# Patient Record
Sex: Female | Born: 1975 | Race: White | Hispanic: No | Marital: Single
Health system: Southern US, Community
[De-identification: ages and names within clinical notes are randomized; demographics above are authoritative.]

---

## 2011-12-27 ENCOUNTER — Ambulatory Visit: Payer: Self-pay | Admitting: Specialist

## 2011-12-27 LAB — COMPREHENSIVE METABOLIC PANEL
Alkaline Phosphatase: 37 U/L — ABNORMAL LOW (ref 50–136)
Anion Gap: 11 (ref 7–16)
BUN: 10 mg/dL (ref 7–18)
Bilirubin,Total: 0.6 mg/dL (ref 0.2–1.0)
Calcium, Total: 8.7 mg/dL (ref 8.5–10.1)
Chloride: 104 mmol/L (ref 98–107)
Creatinine: 0.68 mg/dL (ref 0.60–1.30)
EGFR (African American): >60
Osmolality: 279 (ref 275–301)
Potassium: 3.8 mmol/L (ref 3.5–5.1)
SGPT (ALT): 61 U/L
Sodium: 141 mmol/L (ref 136–145)
Total Protein: 7.7 g/dL (ref 6.4–8.2)

## 2011-12-27 LAB — CBC WITH DIFFERENTIAL/PLATELET
Basophil #: 0.1 10*3/uL (ref 0.0–0.1)
Basophil %: 1.1 %
Eosinophil #: 0.5 10*3/uL (ref 0.0–0.7)
Eosinophil %: 7.4 %
HCT: 42.8 % (ref 35.0–47.0)
HGB: 14.4 g/dL (ref 12.0–16.0)
MCHC: 33.7 g/dL (ref 32.0–36.0)
MCV: 89 fL (ref 80–100)
Monocyte #: 0.5 10*3/uL (ref 0.0–0.7)
Monocyte %: 6.5 %
Neutrophil #: 3.5 10*3/uL (ref 1.4–6.5)
Neutrophil %: 49.4 %
RBC: 4.81 10*6/uL (ref 3.80–5.20)
RDW: 14 % (ref 11.5–14.5)
WBC: 7 10*3/uL (ref 3.6–11.0)

## 2011-12-27 LAB — APTT: Activated PTT: 25.9 secs (ref 23.6–35.9)

## 2011-12-27 LAB — AMYLASE: Amylase: 31 U/L (ref 25–115)

## 2011-12-27 LAB — PROTIME-INR
INR: 0.9
Prothrombin Time: 12.8 secs (ref 11.5–14.7)

## 2012-01-01 ENCOUNTER — Ambulatory Visit: Payer: Self-pay | Admitting: Specialist

## 2012-01-11 ENCOUNTER — Ambulatory Visit: Payer: Self-pay | Admitting: Specialist

## 2012-02-19 ENCOUNTER — Ambulatory Visit: Payer: Self-pay | Admitting: Specialist

## 2012-03-04 ENCOUNTER — Inpatient Hospital Stay: Payer: Self-pay | Admitting: Specialist

## 2012-03-04 LAB — PREGNANCY, URINE: Pregnancy Test, Urine: NEGATIVE m[IU]/mL

## 2012-03-05 LAB — CBC WITH DIFFERENTIAL/PLATELET
Basophil %: 0.4 %
Eosinophil #: 0 10*3/uL (ref 0.0–0.7)
Eosinophil %: 0.1 %
Lymphocyte #: 0.7 10*3/uL — ABNORMAL LOW (ref 1.0–3.6)
Lymphocyte %: 9.4 %
MCH: 29.8 pg (ref 26.0–34.0)
MCHC: 33.4 g/dL (ref 32.0–36.0)
Monocyte #: 0.3 x10 3/mm (ref 0.2–0.9)
Monocyte %: 4 %
Neutrophil %: 86.1 %
Platelet: 180 10*3/uL (ref 150–440)
RBC: 4.41 10*6/uL (ref 3.80–5.20)
RDW: 14.1 % (ref 11.5–14.5)
WBC: 7.9 10*3/uL (ref 3.6–11.0)

## 2012-03-05 LAB — ALBUMIN: Albumin: 3.9 g/dL (ref 3.4–5.0)

## 2012-03-05 LAB — BASIC METABOLIC PANEL
Anion Gap: 11 (ref 7–16)
BUN: 4 mg/dL — ABNORMAL LOW (ref 7–18)
Creatinine: 0.61 mg/dL (ref 0.60–1.30)
EGFR (Non-African Amer.): >60
Osmolality: 273 (ref 275–301)
Potassium: 4.6 mmol/L (ref 3.5–5.1)
Sodium: 138 mmol/L (ref 136–145)

## 2012-03-05 LAB — MAGNESIUM: Magnesium: 1.6 mg/dL — ABNORMAL LOW

## 2012-03-05 LAB — PHOSPHORUS: Phosphorus: 2.6 mg/dL (ref 2.5–4.9)

## 2012-03-25 ENCOUNTER — Ambulatory Visit: Payer: Self-pay | Admitting: Specialist

## 2012-04-12 ENCOUNTER — Ambulatory Visit: Payer: Self-pay | Admitting: Specialist

## 2012-11-28 IMAGING — RF DG UGI W/O KUB
1 series · 15 of 16 positions shown · non-contrast
Comparison: none

REASON FOR EXAM: fatigue obstructed sleep apnea morbid obesity snoring
joint pain
COMMENTS:

[Series 1: run · 15 of 16 slices shown]
[im 1/16]
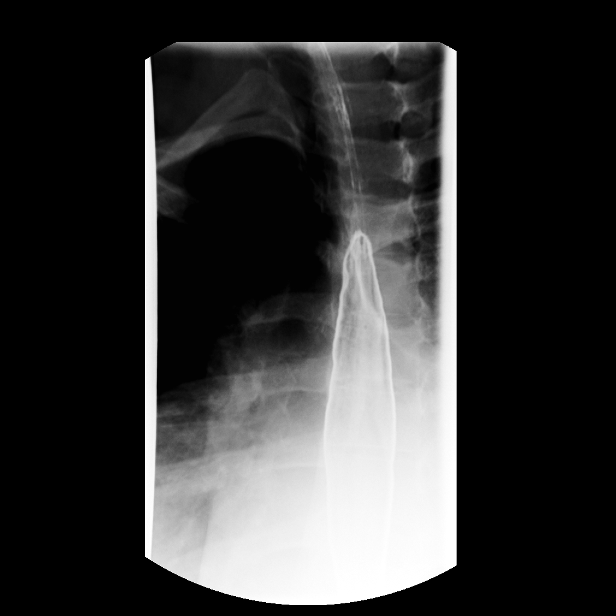
[im 2/16]
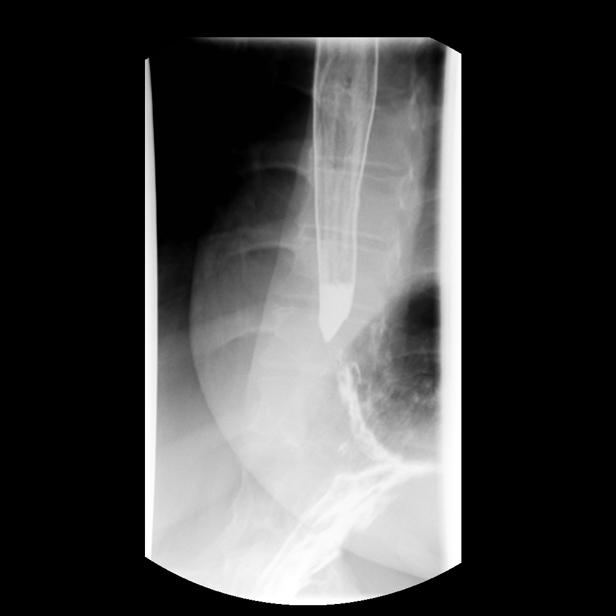
[im 3/16]
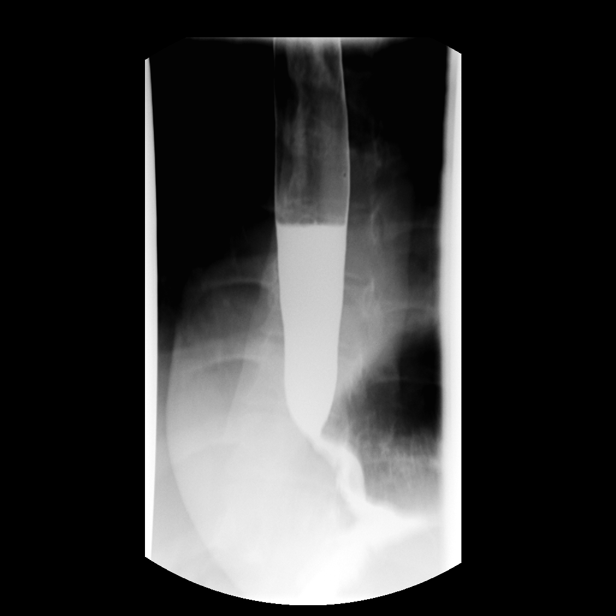
[im 4/16]
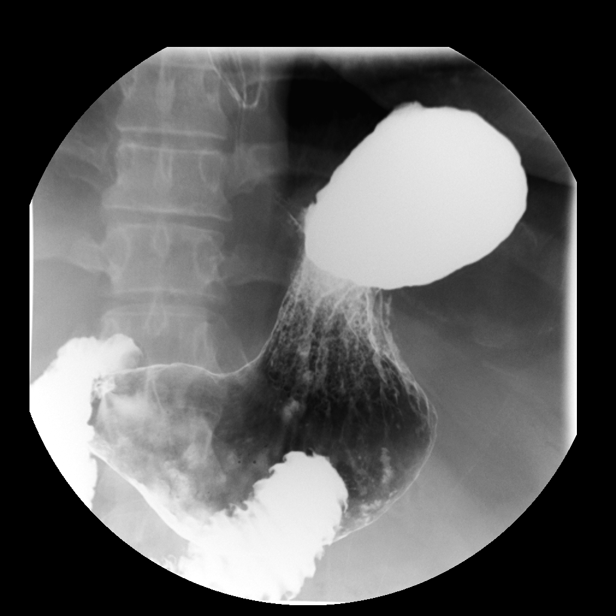
[im 5/16]
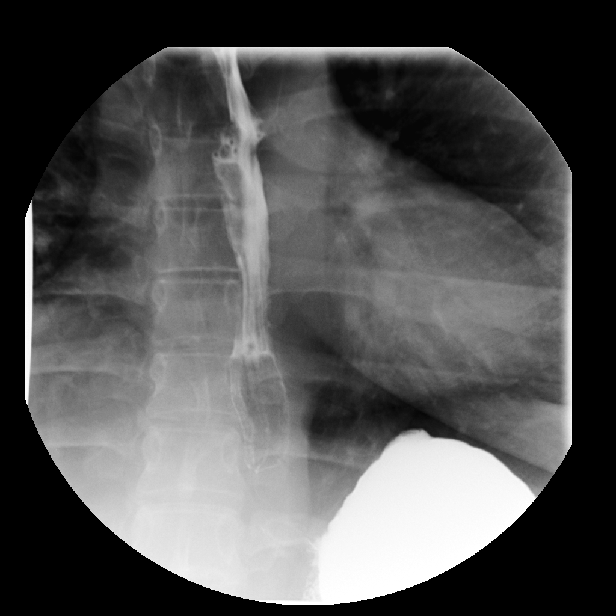
[im 6/16]
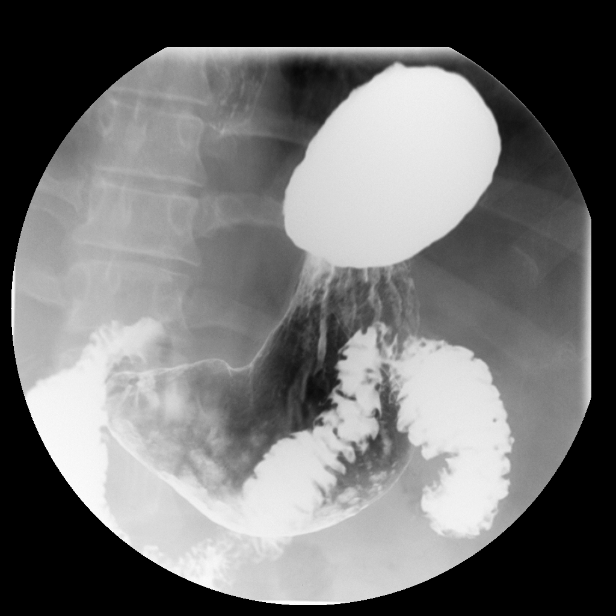
[im 7/16]
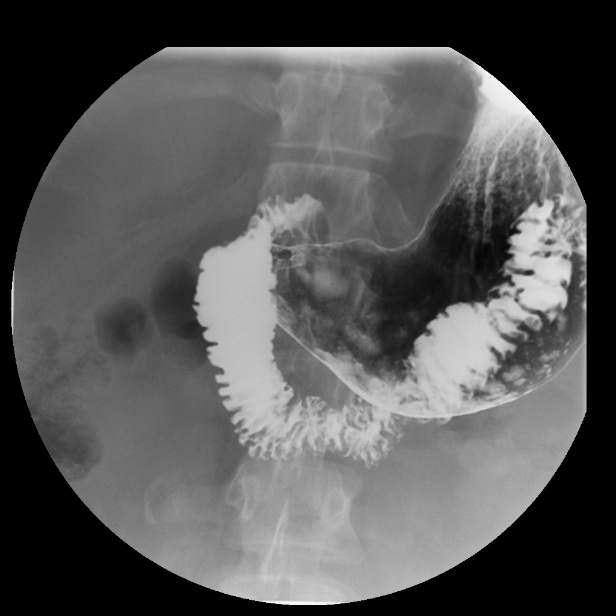
[im 9/16]
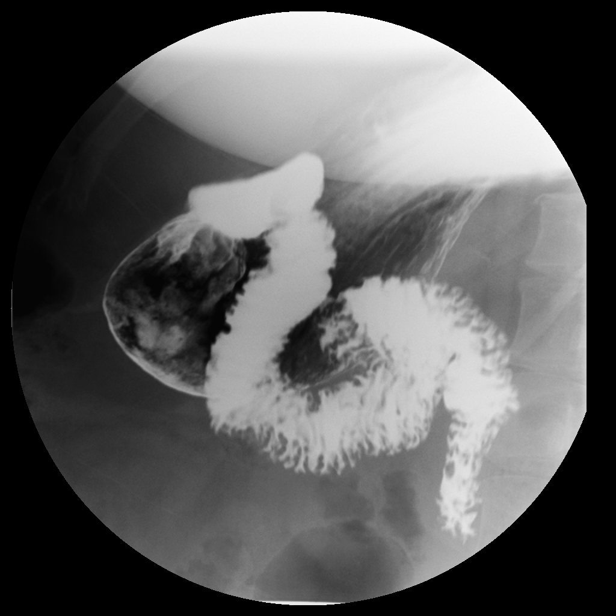
[im 10/16]
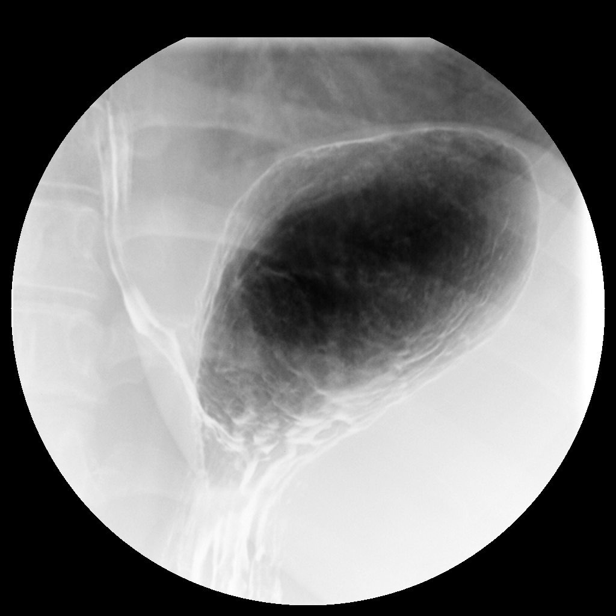
[im 11/16]
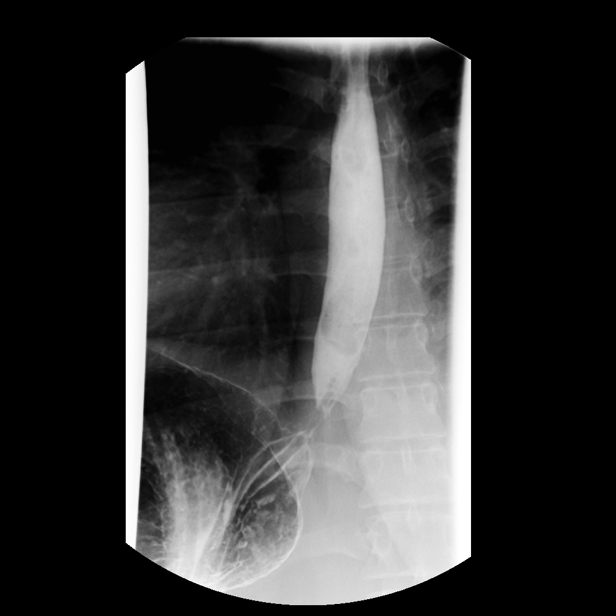
[im 12/16]
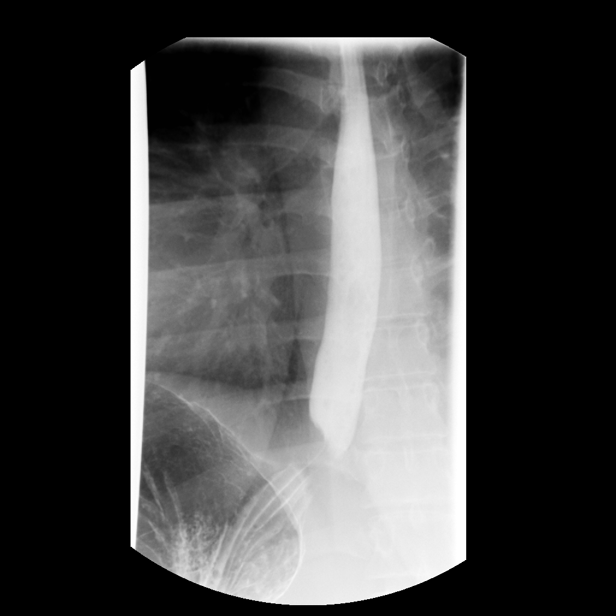
[im 13/16]
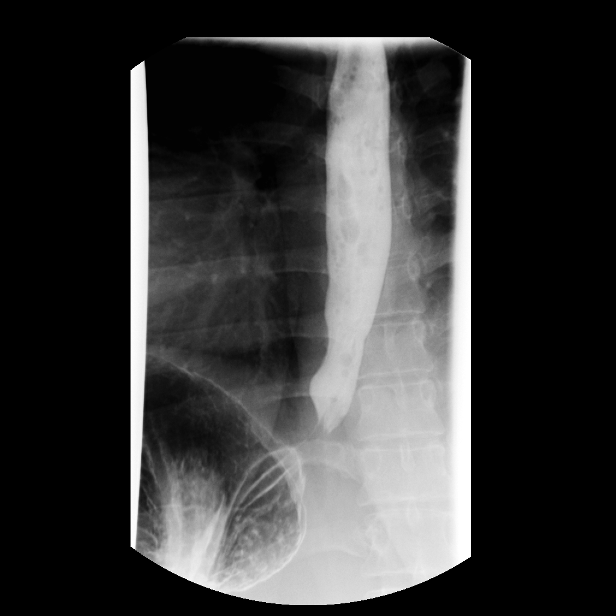
[im 14/16]
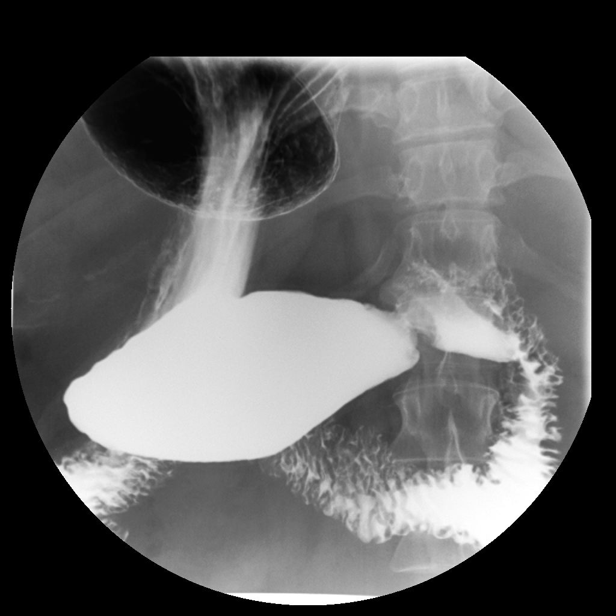
[im 15/16]
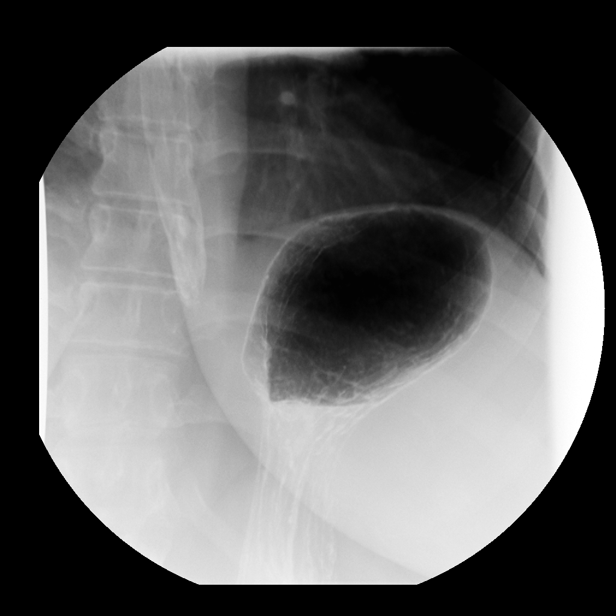
[im 16/16]
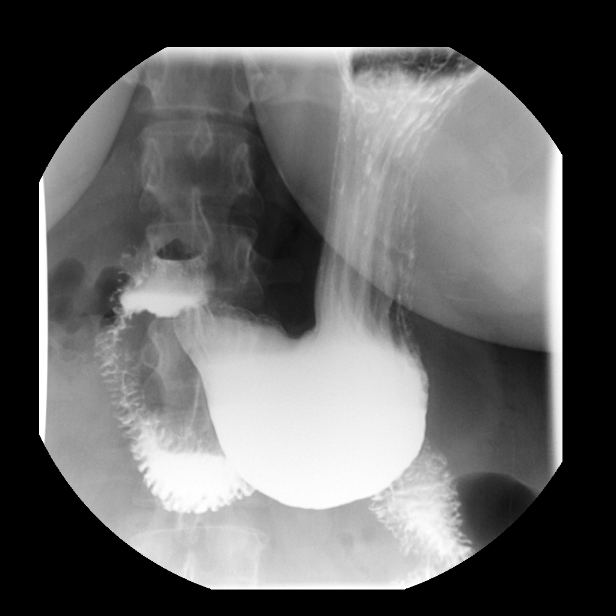

[15 of 16 positions shown; findings below may reference images not displayed]

PROCEDURE:     FL  - FL UPPER GI  - December 27, 2011 [DATE]

RESULT:     The anticipated procedure was discussed with Ms. Tiger. She
voiced her willingness to proceed. The patient ingested thick and thin
barium and gas-forming crystals without difficulty. The esophagus was normal
and a survey fashion. There was no evidence of a hiatal hernia. A small
amount of gastroesophageal reflux was demonstrated. The stomach distended
well. The gastric mucosal folds were normal in appearance. Gastric emptying
appeared normal. The duodenal bulb and C-sweep were normal in appearance.
The 12 mm barium pill passed without difficulty.
IMPRESSION: There is a small amount of gastroesophageal reflux.
Otherwise the examination is within the limits of normal.

## 2015-03-06 NOTE — Op Note (Signed)
PATIENT NAME:  Kara DeutscherCROWELL, Amorette S MR#:  [id number] DATE OF BIRTH:  09-24-1976  DATE OF PROCEDURE:  03/04/2012  PREOPERATIVE DIAGNOSES:  1. Morbid obesity. 2. Possible hiatal hernia.  3. Multiple comorbids.   POSTOPERATIVE DIAGNOSES:  1. Morbid obesity. 2. No significant hiatal hernia.  3. Multiple comorbids.   PROCEDURE:  Laparoscopic sleeve gastrectomy.  SURGEON: Primus BravoJon Bruce, MD  ASSISTANT:  None.  ANESTHESIA:  General endotracheal.  CLINICAL INDICATION:  See History and Physical.   COMPLICATIONS: None.  ESTIMATED BLOOD LOSS: None.  DETAILS OF PROCEDURE:  The patient was taken to the operating room and placed on the operating room table, in the supine position, with appropriate monitors and supplemental oxygen being delivered.  Broad spectrum IV antibiotics were administered. The patient was placed under general anesthesia without incident.  The abdomen was prepped and draped in the usual sterile fashion.  Access was obtained using 5 mm Optical trocar. Pneumoperitoneum was established without difficulty. Multiple other ports were placed in preparation for sleeve gastrectomy. A liver retractor was placed without incident. The entire stomach was mobilized from 5 cm from the pylorus all the way up to the fundus, and the fundus was mobilized off the left crura as well completely freeing up the posterior portion of the stomach. Posterior attachments were taken down so the crura could be visualized from both sides.  At that point, everything was removed from the stomach and a 2234 [place] Bougie was placed down into the antrum. An Echelon green load stapler was used to bisect the antrum on first fire starting approximately 5 to 6 cm from the pylorus. I then continued up along the Bougie using a blue load stapler with excellent affect with care not to get too close to the Bougie itself, with minimal traction. This continued all the way up to the left crura. The excess stomach was placed on the side and  the Bougie was removed and endoscopy showed no evidence of obstruction at that time.  The excess stomach was removed through the abdominal cavity, and the wounds were closed using 4-0 Vicryl and Dermabond.  ____________________________ Primus BravoJon Bruce, MD jb:slb D: 03/04/2012 15:00:42 ET T: 03/04/2012 15:12:26 ET JOB#: [id number]  cc: Primus BravoJon Bruce, MD, <Dictator> Cletis AthensJON Marjean DonnaM BRUCE MD ELECTRONICALLY SIGNED 03/05/2012 8:43
